# Patient Record
Sex: Male | Born: 1950 | Race: Black or African American | Hispanic: No | Marital: Single | Smoking: Former smoker
Health system: Southern US, Community
[De-identification: ages and names within clinical notes are randomized; demographics above are authoritative.]

## PROBLEM LIST (undated history)

## (undated) DIAGNOSIS — I519 Heart disease, unspecified: Secondary | ICD-10-CM

## (undated) DIAGNOSIS — F0390 Unspecified dementia without behavioral disturbance: Secondary | ICD-10-CM

## (undated) DIAGNOSIS — E7800 Pure hypercholesterolemia, unspecified: Secondary | ICD-10-CM

## (undated) DIAGNOSIS — I10 Essential (primary) hypertension: Secondary | ICD-10-CM

## (undated) HISTORY — PX: OTHER SURGICAL HISTORY: SHX169

## (undated) HISTORY — DX: Pure hypercholesterolemia, unspecified: E78.00

## (undated) HISTORY — DX: Essential (primary) hypertension: I10

## (undated) HISTORY — DX: Unspecified dementia, unspecified severity, without behavioral disturbance, psychotic disturbance, mood disturbance, and anxiety: F03.90

## (undated) HISTORY — DX: Heart disease, unspecified: I51.9

---

## 2019-10-13 ENCOUNTER — Ambulatory Visit: Payer: Medicare HMO | Admitting: Neurology

## 2019-10-20 ENCOUNTER — Encounter: Payer: Self-pay | Admitting: Neurology

## 2019-10-20 ENCOUNTER — Other Ambulatory Visit: Payer: Self-pay

## 2019-10-20 ENCOUNTER — Telehealth: Payer: Self-pay | Admitting: *Deleted

## 2019-10-20 ENCOUNTER — Ambulatory Visit (INDEPENDENT_AMBULATORY_CARE_PROVIDER_SITE_OTHER): Payer: Medicare HMO | Admitting: Neurology

## 2019-10-20 VITALS — BP 136/69 | HR 44 | Temp 97.5°F | Ht 67.5 in | Wt 233.0 lb

## 2019-10-20 DIAGNOSIS — F0391 Unspecified dementia with behavioral disturbance: Secondary | ICD-10-CM

## 2019-10-20 DIAGNOSIS — G309 Alzheimer's disease, unspecified: Secondary | ICD-10-CM | POA: Diagnosis not present

## 2019-10-20 DIAGNOSIS — R4189 Other symptoms and signs involving cognitive functions and awareness: Secondary | ICD-10-CM | POA: Diagnosis not present

## 2019-10-20 DIAGNOSIS — R4689 Other symptoms and signs involving appearance and behavior: Secondary | ICD-10-CM

## 2019-10-20 DIAGNOSIS — F03918 Unspecified dementia, unspecified severity, with other behavioral disturbance: Secondary | ICD-10-CM | POA: Insufficient documentation

## 2019-10-20 DIAGNOSIS — F688 Other specified disorders of adult personality and behavior: Secondary | ICD-10-CM | POA: Diagnosis not present

## 2019-10-20 NOTE — Telephone Encounter (Signed)
R/c medical records from [facility] [phone] fax phone # [phone]. Notes on [place] desk

## 2019-10-20 NOTE — Patient Instructions (Addendum)
Blood work today MRI of the brain - to look at the structure of the brain (for example, look for strokes or other causes of dementia) - will call to schedule FDG PET SCan - For dementia - see information given - will call to schedule Need records from the [place] Follow up when all is completed above 3 or 4 months Continue Namenda   Dementia Dementia is a condition that affects the way the brain functions. It often affects memory and thinking. Usually, dementia gets worse with time and cannot be reversed (progressive dementia). There are many types of dementia, including:  Alzheimer's disease. This type is the most common.  Vascular dementia. This type may happen as the result of a stroke.  Lewy body dementia. This type may happen to people who have Parkinson's disease.  Frontotemporal dementia. This type is caused by damage to nerve cells (neurons) in certain parts of the brain. Some people may be affected by more than one type of dementia. This is called mixed dementia. What are the causes? Dementia is caused by damage to cells in the brain. The area of the brain and the types of cells damaged determine the type of dementia. Usually, this damage is irreversible or cannot be undone. Some examples of irreversible causes include:  Conditions that affect the blood vessels of the brain, such as diabetes, heart disease, or blood vessel disease.  Genetic mutations. In some cases, changes in the brain may be caused by another condition and can be reversed or slowed. Some examples of reversible causes include:  Injury to the brain.  Certain medicines.  Infection, such as meningitis.  Metabolic problems, such as vitamin B12 deficiency or thyroid disease.  Pressure on the brain, such as from a tumor or blood clot. What are the signs or symptoms? Symptoms of dementia depend on the type of dementia. Common signs of dementia include problems with remembering, thinking, problem solving, decision  making, and communicating. These signs develop slowly or get worse with time. This may include:  Problems remembering things.  Having trouble taking a bath or putting clothes on.  Forgetting appointments.  Forgetting to pay bills.  Difficulty planning and preparing meals.  Having trouble speaking.  Getting lost easily. How is this diagnosed? This condition is diagnosed by a specialist (neurologist). It is diagnosed based on the history of your symptoms, your medical history, a physical exam, and tests. Tests may include:  Tests to evaluate brain function, such as memory tests, cognitive tests, and other tests.  Lab tests, such as blood or urine tests.  Imaging tests, such as a CT scan, a PET scan, or an MRI.  Genetic testing. This may be done if other family members have a diagnosis of certain types of dementia. Your health care provider will talk with you and your family, friends, or caregivers about your history and symptoms. How is this treated?  Treatment for this condition depends on the cause of the dementia. Progressive dementias, such as Alzheimer's disease, cannot be cured, but there may be treatments that help to manage symptoms. Treatment might involve taking medicines that may help to:  Control the dementia.  Slow down the progression of the dementia.  Manage symptoms. In some cases, treating the cause of your dementia can improve symptoms, reverse symptoms, or slow down how quickly your dementia becomes worse. Your health care provider can direct you to support groups, organizations, and other health care providers who can help with decisions about your care. Follow these  instructions at home: Medicines  Take over-the-counter and prescription medicines only as told by your health care provider.  Use a pill organizer or pill reminder to help you manage your medicines.  Avoid taking medicines that can affect thinking, such as pain medicines or sleeping  medicines. Lifestyle  Make healthy lifestyle choices. ? Be physically active as told by your health care provider. ? Do not use any products that contain nicotine or tobacco, such as cigarettes, e-cigarettes, and chewing tobacco. If you need help quitting, ask your health care provider. ? Do not drink alcohol. ? Practice stress-management techniques when you get stressed. ? Spend time with other people.  Make sure to get quality sleep. These tips can help you get a good night's rest: ? Avoid napping during the day. ? Keep your sleeping area dark and cool. ? Avoid exercising during the few hours before you go to bed. ? Avoid caffeine products in the evening. Eating and drinking  Drink enough fluid to keep your urine pale yellow.  Eat a healthy diet. General instructions   Work with your health care provider to determine what you need help with and what your safety needs are.  Talk with your health care provider about whether it is safe for you to drive.  If you were given a bracelet that identifies you as a person with memory loss or tracks your location, make sure to wear it at all times.  Work with your family to make important decisions, such as advance directives, medical power of attorney, or a living will.  Keep all follow-up visits as told by your health care provider. This is important. Where to find more information  Alzheimer's Association: CapitalMile.co.nz  National Institute on Aging: DVDEnthusiasts.nl  World Health Organization: RoleLink.com.br Contact a health care provider if:  You have any new or worsening symptoms.  You have problems with choking or swallowing. Get help right away if:  You feel depressed or sad, or feel that you want to harm yourself.  Your family members become concerned for your safety. If you ever feel like you may hurt yourself or others, or have thoughts about taking your own life, get help right away. You can go to your nearest  emergency department or call:  Your local emergency services (911 in the [place]  A suicide crisis helpline, such as the [place] at (660)131-5096. This is open 24 hours a day. Summary  Dementia is a condition that affects the way the brain functions. Dementia often affects memory and thinking.  Usually, dementia gets worse with time and cannot be reversed (progressive dementia).  Treatment for this condition depends on the cause of the dementia.  Work with your health care provider to determine what you need help with and what your safety needs are.  Your health care provider can direct you to support groups, organizations, and other health care providers who can help with decisions about your care. This information is not intended to replace advice given to you by your health care provider. Make sure you discuss any questions you have with your health care provider. Document Revised: 08/27/2018 Document Reviewed: 08/27/2018 Elsevier Patient Education  [place].

## 2019-10-20 NOTE — Progress Notes (Signed)
[name]    Provider:  Dr Lucia Gaskins Requesting Provider: Chari Manning, DO Primary Care Provider:  Chari Manning, DO  CC:  Memory loss  HPI:  Evan Tran is a 69 y.o. male here as requested by Bebe Liter, Virgilus U, DO for early onset Alzheimer's dementia without behavioral disturbance.  Past medical history hypertension, hypercholesterolemia, heart disease, dementia.  Patient was recently diagnosed with early onset dementia at the [facility] facility per Dr. Trey Sailors notes which I reviewed: Patient recently had blood work done at the [facility] in October 2020 which was negative for medical causes of dementia, he was prescribed Namenda which at the time he had not started, he was also requesting another referral to another neurologist outside of the [place].  Unfortunately I do not have the [place] notes, I can try to request them which is oftentimes difficult but we will try.  Right I reviewed our epic EMR as well as care everywhere and unfortunately I have no other information on this patient. He is here with his sister who provides most information. He was going to the [facility] Per family no imaging or blood work was completed.  Family noticed 6 months ago, worsening, unclear how long really, started forgetting things more short-term memory loss, socially withdrawn, he lives in [place], he lives with a roommate, he is still driving, he has not had accidents in the car, he doesn't know if he has ever gotten lost, sister's help with finances he can;t manage finances anymore they started helping in December, he was missing bills, sisters live near him are more involved, sister has to help him with appointments, he doesn't know what day it is or month, he denies feeling sad, apathetic, he gets angry and frustrated and he is starting to yell and do things he would not normally do in the past, he is cursing and it is unusual never did it in the past, no hallucinations, no  delusions, he has a very rigid schedule almost obsessive with his schedule, he goes to his sister's house every morning for breakfast.Half brother with alzheimers. He has a history of alcohol use unknown amount, no drugs, used to smoke. 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 Hygiene is fine, eats himself but doesn't cook anymore. He denies snoring or significant daytime drowsiness.   Reviewed notes, labs and imaging from outside physicians, which showed:  I have no other records, I will request notes from the [place].  Review of Systems: Patient complains of symptoms per HPI as well as the following symptoms: memory loss, obesity. Pertinent negatives and positives per HPI. All others negative.   Social History   Socioeconomic History  . Marital status: Single    Spouse name: Not on file  . Number of children: Not on file  . Years of education: Not on file  . Highest education level: Not on file  Occupational History  . Not on file  Tobacco Use  . Smoking status: Former Games developer  . Smokeless tobacco: Never Used  Substance and Sexual Activity  . Alcohol use: Not Currently    Comment: in the past   . Drug use: Never  . Sexual activity: Not on file  Other Topics Concern  . Not on file  Social History Narrative   Lives with a friend    Social Determinants of Health   Financial Resource Strain:   . Difficulty of Paying Living Expenses:   Food Insecurity:   . Worried  About Running Out of Food in the Last Year:   . [place] in the Last Year:   Transportation Needs:   . Lack of Transportation (Medical):   Marland Kitchen Lack of Transportation (Non-Medical):   Physical Activity:   . Days of Exercise per Week:   . Minutes of Exercise per Session:   Stress:   . Feeling of Stress :   Social Connections:   . Frequency of Communication with Friends and Family:   . Frequency of Social Gatherings with Friends and Family:   . Attends Religious Services:   . Active Member of Clubs or  Organizations:   . Attends Archivist Meetings:   Marland Kitchen Marital Status:   Intimate Partner Violence:   . Fear of Current or Ex-Partner:   . Emotionally Abused:   Marland Kitchen Physically Abused:   . Sexually Abused:     Family History  Problem Relation Age of Onset  . Heart attack Mother   . Lung cancer Father   . Alzheimer's disease Brother        half brother     Past Medical History:  Diagnosis Date  . Dementia ([place])   . Heart disease    stents   . Hypercholesteremia   . Hypertension     Patient Active Problem List   Diagnosis Date Noted  . Dementia with behavioral disturbance ([place]) 10/20/2019    Past Surgical History:  Procedure Laterality Date  . heart stents placed      Current Outpatient Medications  Medication Sig Dispense Refill  . amLODipine (NORVASC) 10 MG tablet Take 10 mg by mouth daily.    Marland Kitchen atorvastatin (LIPITOR) 80 MG tablet Take 80 mg by mouth daily.    . carvedilol (COREG) 12.5 MG tablet Take 2 tablets by mouth in the morning and at bedtime.    . hydrochlorothiazide (HYDRODIURIL) 25 MG tablet Take 25 mg by mouth daily.    Marland Kitchen losartan (COZAAR) 100 MG tablet Take 100 mg by mouth daily.    . memantine (NAMENDA) 5 MG tablet Take 5 mg by mouth in the morning and at bedtime.    . potassium chloride SA (KLOR-CON) 20 MEQ tablet Take 1 tablet by mouth in the morning and at bedtime.     No current facility-administered medications for this visit.    Allergies as of 10/20/2019 - Review Complete 10/20/2019  Allergen Reaction Noted  . Lisinopril Cough 04/20/2008    Vitals: BP 136/69 (BP Location: Right Arm, Patient Position: Sitting)   Pulse (!) 44   Temp (!) 97.5 F (36.4 C) Comment: sister 97.8 both taken at front  Ht 5' 7.5" (1.715 m)   Wt 233 lb (105.7 kg)   BMI 35.95 kg/m  Last Weight:  Wt Readings from Last 1 Encounters:  10/20/19 233 lb (105.7 kg)   Last Height:   Ht Readings from Last 1 Encounters:  10/20/19 5' 7.5" (1.715 m)      Physical exam: Exam: Gen: flat affect, irritable, not conversant                CV: RRR, no MRG. No Carotid Bruits. No peripheral edema, warm, nontender Eyes: Conjunctivae clear without exudates or hemorrhage  Neuro: Detailed Neurologic Exam  Speech:    Speech is  fluent with impaired comprehension.  Cognition:  MMSE - Mini Mental State Exam 10/20/2019  Orientation to time 0  Orientation to time comments pt perseverated, kept giving his birthdate as the answer  Orientation  to Place 0  Orientation to Place-comments pt perseverated, kept giving his birthdate as the answer  Registration 3  Attention/ Calculation 0  Attention/Calculation-comments pt refused to attempt both calculation & spelling WORLD  Recall 0  Language- name 2 objects 2  Language- repeat 1  Language- follow 3 step command 3  Language- read & follow direction 0  Language-read & follow direction-comments pt refused to participate when command asked  Write a sentence 0  Write a sentence-comments pt refused to participate when command asked  Copy design 0  Copy design-comments pt refused to participate when command asked  Total score 9    Cranial Nerves:    The pupils are equal, round, and reactive to light. attempted fundoscopy could not visualize.  Visual fields are full to finger confrontation. Extraocular movements are intact. Trigeminal sensation is intact and the muscles of mastication are normal. The face is symmetric. The palate elevates in the midline. Hearing intact. Voice is normal. Shoulder shrug is normal. The tongue has normal motion without fasciculations.   Coordination:    Normal finger to nose   Gait:    Slightly wide based and antalgic, flexion at the waist (chronic LBP). But can walk on heels,toes and tandem.  Motor Observation:    No asymmetry, no atrophy, and no involuntary movements noted. Tone:    Normal muscle tone.       Strength:    Strength is V/V in the upper and lower  limbs.      Sensation: intact to LT     Reflex Exam:  DTR's:    Deep tendon reflexes in the upper and lower extremities are normal bilaterally.   Toes:    Left equiv right down  Clonus:    Clonus is absent.    Assessment/Plan:  69 year old that appears to be significantly cognitively impaired however I really don;t have any previous records, will request from the [place]. Alzheimer's vs Frontotemporal or vascular dementia or possibly overlapping types.  - MRI of the brain to eval for causes of dementia, vascular/strokes or other - NEED FDG PET Scan, patient with symptoms of both ALZ but also FTD need to differentiate between the two.  He refused to answer many of the questions on the Mini-Mental status exam today, I am doubtful we could complete Montreal cognitive assessment test or formal memory testing MMSE 9/30  HR is 44, aricept contraindicated. Continue namenda.   Orders Placed This Encounter  Procedures  . MR BRAIN W WO CONTRAST  . NM PET Metabolic Brain  . Comprehensive metabolic panel  . CBC  . B12 and Folate Panel  . Methylmalonic acid, serum  . Vitamin B1  . RPR  . Homocysteine  . HIV antibody (with reflex)     Cc: Ozoude, Virgilus U, DO,    Naomie Dean, MD  I spent 65 minutes of face-to-face and non-face-to-face time with patient on the  1. Dementia with behavioral disturbance, unspecified dementia type (HCC)   2. Cognitive and behavioral changes   3. Personality change   4. Cognitive decline   5. Alzheimer's disease, unspecified (CODE) (HCC)    diagnosis.  This included previsit chart review, lab review, study review, order entry, electronic health record documentation, patient education on the different diagnostic and therapeutic options, counseling and coordination of care, risks and benefits of management, compliance, or risk factor reduction   [facility] [address] [place], [place] [address]  Phone [phone]  Fax [phone]

## 2019-10-22 ENCOUNTER — Telehealth: Payer: Self-pay | Admitting: Neurology

## 2019-10-22 NOTE — Telephone Encounter (Signed)
Ethlyn Gallery: [id number] (exp. 10/22/19 to 11/21/19) order sent to GI. They will reach out to the patient to schedule.

## 2019-10-23 ENCOUNTER — Telehealth: Payer: Self-pay | Admitting: *Deleted

## 2019-10-23 NOTE — Telephone Encounter (Signed)
Called patient's home, spoke with sister and informed her his labs are okay. She verbalized understanding, appreciation.

## 2019-10-25 LAB — COMPREHENSIVE METABOLIC PANEL
ALT: 24 IU/L (ref 0–44)
AST: 21 IU/L (ref 0–40)
Albumin/Globulin Ratio: 1.9 (ref 1.2–2.2)
Albumin: 4.3 g/dL (ref 3.8–4.8)
Alkaline Phosphatase: 108 IU/L (ref 39–117)
BUN/Creatinine Ratio: 9 — ABNORMAL LOW (ref 10–24)
BUN: 11 mg/dL (ref 8–27)
Bilirubin Total: 0.4 mg/dL (ref 0.0–1.2)
CO2: 28 mmol/L (ref 20–29)
Calcium: 10 mg/dL (ref 8.6–10.2)
Chloride: 101 mmol/L (ref 96–106)
Creatinine, Ser: 1.18 mg/dL (ref 0.76–1.27)
GFR calc Af Amer: 73 mL/min/{1.73_m2} (ref >59)
GFR calc non Af Amer: 63 mL/min/{1.73_m2} (ref >59)
Globulin, Total: 2.3 g/dL (ref 1.5–4.5)
Glucose: 102 mg/dL — ABNORMAL HIGH (ref 65–99)
Potassium: 3.8 mmol/L (ref 3.5–5.2)
Sodium: 142 mmol/L (ref 134–144)
Total Protein: 6.6 g/dL (ref 6.0–8.5)

## 2019-10-25 LAB — B12 AND FOLATE PANEL
Folate: 9.7 ng/mL (ref >3.0)
Vitamin B-12: 416 pg/mL (ref 232–1245)

## 2019-10-25 LAB — RPR: RPR Ser Ql: NONREACTIVE

## 2019-10-25 LAB — METHYLMALONIC ACID, SERUM: Methylmalonic Acid: 217 nmol/L (ref 0–378)

## 2019-10-25 LAB — CBC
Hematocrit: 41.8 % (ref 37.5–51.0)
Hemoglobin: 13.9 g/dL (ref 13.0–17.7)
MCH: 29.4 pg (ref 26.6–33.0)
MCHC: 33.3 g/dL (ref 31.5–35.7)
MCV: 88 fL (ref 79–97)
Platelets: 305 10*3/uL (ref 150–450)
RBC: 4.73 x10E6/uL (ref 4.14–5.80)
RDW: 12.5 % (ref 11.6–15.4)
WBC: 6 10*3/uL (ref 3.4–10.8)

## 2019-10-25 LAB — HIV ANTIBODY (ROUTINE TESTING W REFLEX): HIV Screen 4th Generation wRfx: NONREACTIVE

## 2019-10-25 LAB — VITAMIN B1: Thiamine: 136.5 nmol/L (ref 66.5–200.0)

## 2019-10-25 LAB — HOMOCYSTEINE: Homocysteine: 14 umol/L (ref 0.0–17.2)

## 2019-11-05 ENCOUNTER — Ambulatory Visit (HOSPITAL_COMMUNITY): Payer: Medicare HMO

## 2019-11-07 ENCOUNTER — Other Ambulatory Visit: Payer: Self-pay

## 2019-11-07 ENCOUNTER — Ambulatory Visit (HOSPITAL_COMMUNITY)
Admission: RE | Admit: 2019-11-07 | Discharge: 2019-11-07 | Disposition: A | Discharge status: Home / Self Care | Payer: Medicare HMO | Source: Ambulatory Visit | Attending: Neurology | Admitting: Neurology

## 2019-11-07 DIAGNOSIS — F688 Other specified disorders of adult personality and behavior: Secondary | ICD-10-CM | POA: Diagnosis present

## 2019-11-07 DIAGNOSIS — R4689 Other symptoms and signs involving appearance and behavior: Secondary | ICD-10-CM | POA: Diagnosis present

## 2019-11-07 DIAGNOSIS — R4189 Other symptoms and signs involving cognitive functions and awareness: Secondary | ICD-10-CM

## 2019-11-07 DIAGNOSIS — F0391 Unspecified dementia with behavioral disturbance: Secondary | ICD-10-CM | POA: Diagnosis present

## 2019-11-07 LAB — GLUCOSE, CAPILLARY: Glucose-Capillary: 96 mg/dL (ref 70–99)

## 2019-11-07 MED ORDER — FLUDEOXYGLUCOSE F - 18 (FDG) INJECTION
9.9600 | Freq: Once | INTRAVENOUS | Status: AC
  Administered 2019-11-07: 9.96 via INTRAVENOUS

## 2019-11-11 ENCOUNTER — Telehealth: Payer: Self-pay | Admitting: *Deleted

## 2019-11-11 NOTE — Telephone Encounter (Signed)
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 This is the second most common dementia next to Alzheimer's disease. Please talk to wife and we can discuss further at appointment. She can find information about this dementia on WesternTunes.[email or web address].  Frontotemporal dementia (FTD), a common cause of dementia, is a group of disorders that occur when nerve cells in the frontal and temporal lobes of the brain are lost. This causes the lobes to shrink. FTD can affect behavior, personality, language, and movement.

## 2019-11-11 NOTE — Telephone Encounter (Signed)
Spoke with pt's sister Lottie (on [place]) and discussed the pet metabolic brain scan results as noted below consistent with FTD. Her questions were answered. She asked if MRI brain still needed and I advised yes per Dr. Lucia Gaskins she would like to still have this done. The pet metabolic results will be discussed in more detail at the follow-up. She verbalized appreciation for the call.

## 2019-11-20 ENCOUNTER — Other Ambulatory Visit: Payer: Self-pay

## 2019-11-20 ENCOUNTER — Ambulatory Visit
Admission: RE | Admit: 2019-11-20 | Discharge: 2019-11-20 | Disposition: A | Discharge status: Home / Self Care | Payer: Medicare HMO | Source: Ambulatory Visit | Attending: Neurology | Admitting: Neurology

## 2019-11-20 DIAGNOSIS — F0391 Unspecified dementia with behavioral disturbance: Secondary | ICD-10-CM | POA: Diagnosis not present

## 2019-11-20 MED ORDER — GADOBENATE DIMEGLUMINE 529 MG/ML IV SOLN
15.0000 mL | Freq: Once | INTRAVENOUS | Status: AC | PRN
  Administered 2019-11-20: 15 mL via INTRAVENOUS

## 2019-11-27 ENCOUNTER — Telehealth: Payer: Self-pay | Admitting: *Deleted

## 2019-11-27 NOTE — Telephone Encounter (Signed)
-----   Message from Anson Fret, MD sent at 11/22/2019  8:45 AM EDT ----- MRI of the brain is also consistent with Frontotemporal dementia, supporting the findings of the PET scan. thanks

## 2019-11-27 NOTE — Telephone Encounter (Signed)
Spoke with pt's sister Lottie (on [place]) and discussed MRI brain results are also consistent with frontotemporal dementia which supports the findings on the FDG pet scan that the patient had. Her questions were answered. Pt's next office visit is on 02/18/20 @ 8:30 AM. She verbalized appreciation for the call.

## 2020-02-17 ENCOUNTER — Telehealth: Payer: Self-pay | Admitting: Neurology
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
## 2020-02-17 NOTE — Telephone Encounter (Signed)
Would you please call patient and see if they would like to schedule tomorrow as video visit due to the delta variant increase? We are seeing even vaccinated people infected as they know.  Or they can  Reschedule for office visit for a few months in the future with Amy or Megan to give everyone a chance to get the booster shot and for the delta variant to hopefully decrease. thanks

## 2020-02-18 ENCOUNTER — Ambulatory Visit (INDEPENDENT_AMBULATORY_CARE_PROVIDER_SITE_OTHER): Payer: Medicare HMO | Admitting: Neurology

## 2020-02-18 ENCOUNTER — Encounter: Payer: Self-pay | Admitting: Neurology

## 2020-02-18 VITALS — BP 121/70 | HR 43 | Ht 66.0 in | Wt 231.0 lb

## 2020-02-18 DIAGNOSIS — G3109 Other frontotemporal dementia: Secondary | ICD-10-CM | POA: Insufficient documentation

## 2020-02-18 DIAGNOSIS — F0281 Dementia in other diseases classified elsewhere with behavioral disturbance: Secondary | ICD-10-CM

## 2020-02-18 NOTE — Patient Instructions (Signed)
Recommend Neurology at the [facility] and psychiatry Stop Memantine   Frontotemporal Dementia  Frontotemporal dementia (FTD) is a rare, progressive brain disorder that causes memory loss. FTD describes a range of diseases that often start with changes in behavior, speech, and thinking. As FTD progresses, it affects short-term memory. Over time, FTD causes the frontal and temporal anterior lobes of the brain to shrink. These are the parts of the brain that control behavior and speech. There are three main types of FTD:  Behavioral variant FTD. This is the most common type and was previously called Pick disease.  Progressive agrammatic aphasia.  Semantic variant primary progressive aphasia. FTD is one of the most common causes of progressive memory loss in people younger than 60 years. The disease progresses faster in some people than in others. In some families, FTD can be associated with amyotrophic lateral sclerosis (ALS). There is no cure for FTD, but treatment and supportive care can improve a person's quality of life. What are the causes? The exact cause of this condition is not known, although many genetic mutations are known to cause the disease. What increases the risk? This condition is more likely to occur in people who have a family history of FTD. Family members of people with FTD should think about genetic counseling. What are the signs or symptoms? Signs and symptoms of FTD usually start between the ages of 55-65 years. Symptoms may include:  Impulsive, poorly controlled, inappropriate, or embarrassing behavior.  Lack of motivation and interest (apathy).  Irritability and agitation.  Neglect of personal hygiene.  Withdrawal.  Inappropriate crying or laughing (pseudobulbar affect).  Repetitive behaviors.  Impulsive eating  Lack of concern for others.  Failure to recognize behavioral changes.  Loss of the ability to speak fluently.  Inability to write or  read.  Slurred speech.  Loss of vocabulary.  New drug or alcohol abuse. Short-term memory loss is also a symptom later in the disease. How is this diagnosed? Your health care provider may suspect FTD if you have worsening behavior or speech difficulties. You may need to see specialists in brain and behavioral health who will collect your medical history and do a neurological examination. The following tests may be done:  Blood tests to rule out other causes, like vitamin deficiency, harmful effects of substances (toxicities), and infections.  Spinal tap (lumbar puncture) to check spinal fluid samples for abnormal proteins.  Imaging tests, such as a CT scan, PET scan, or MRI. These can show brain changes that suggest FTD or another brain disorder.  Memory testing (neuropsychological testing), which involves several hours of standardized tests to check the many functions of the brain. How is this treated? There is no cure for this condition and the progression of FTD cannot be stopped. Support at home is the most important aspect of managing FTD. Ask about caregiving resources in your community. Management of this condition may include:  Antidepressants to help with apathy.  Medicines to treat pseudobulbar affect.  Sedative medicines to control aggressive or dangerous behavior.  Speech and language therapy.  Behavioral therapy.  Occupational therapy to help with home safety and activity. Institutional or supportive care may eventually be needed. Follow these instructions at home: Eating and drinking  Follow a healthy diet. Eat foods that are high in fiber, such as fresh fruits and vegetables, whole grains, and beans.  Avoid having too much sugar and caffeine in your diet.  Watch for signs of compulsive eating, which can lead to other health  problems.  Do not drink alcohol.  Drink enough fluid to keep your urine pale yellow. Lifestyle  Keep a regular routine.  Avoid  stress and new situations.  Avoid any activities that may trigger aggressive behavior.  Schedule regular, enjoyable, and supervised physical activity. General instructions  Take over-the-counter and prescription medicines only as told by your health care provider.  If you were given a bracelet that tracks your location, make sure you wear it.  Work with your health care provider to determine what you need help with and what your safety needs are.  Keep all follow-up visits, including any therapy visits, as told by your health care provider. This is important. Contact a health care provider if:  Your symptoms change or get worse.  It becomes more difficult or stressful to be cared for at home. Get help right away if:  It is no longer possible to be cared for at home.  You or your family members become concerned for your safety. Summary  Frontotemporal dementia (FTD) is a rare, progressive brain disorder that causes memory loss.  There is no cure for this condition and the progression of FTD cannot be stopped.  Support at home is the most important aspect of managing FTD. Ask about caregiving resources in your community.  Work with your health care provider to determine what you need help with and what your safety needs are. This information is not intended to replace advice given to you by your health care provider. Make sure you discuss any questions you have with your health care provider. Document Revised: 10/03/2018 Document Reviewed: 09/05/2016 Elsevier Patient Education  2020 [employer].

## 2020-02-23 ENCOUNTER — Telehealth: Payer: Self-pay | Admitting: *Deleted

## 2020-02-23 NOTE — Telephone Encounter (Signed)
DMV form completed, signed, and sent to medical records for processing. MD advises no driving.

## 2020-02-25 ENCOUNTER — Telehealth: Payer: Self-pay | Admitting: *Deleted

## 2020-02-25 NOTE — Telephone Encounter (Signed)
Called LVM unable to reach pt. Pt need to fill out his part of the form. Please ask for [facility]

## 2020-02-26 ENCOUNTER — Telehealth: Payer: Self-pay | Admitting: *Deleted

## 2020-02-26 NOTE — Telephone Encounter (Signed)
Mailbox is full.

## 2020-11-15 IMAGING — PT NM PET TUM IMG INITIAL (PI) WHOLE BODY
1 series · 19 of 25 positions shown · non-contrast
Comparison: Brain MRI 10/20/2019 (unavailable)

CLINICAL DATA: Dementia. Frontotemporal dementia versus Alzheimer's
type dementia

EXAM:
NM PET METABOLIC BRAIN
TECHNIQUE: 10.0 mCi F-18 FDG was injected intravenously. Full-ring PET imaging
was performed from the vertex to skull base. CT data was obtained
and used for attenuation correction and anatomic localization.
FASTING BLOOD GLUCOSE:  Value: 96 mg/dl

[Series 1045: mpr 3mm range · 0.83mm/px · 19 of 41 slices shown]
[im 1/41]
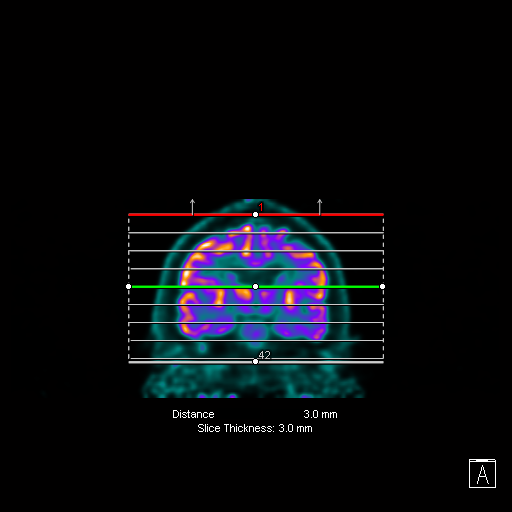
[im 2/41]
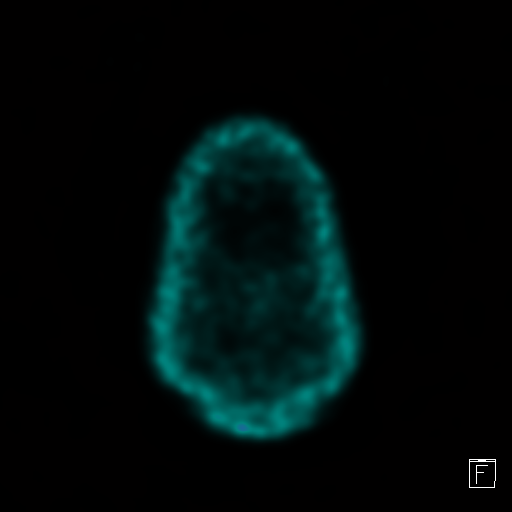
[im 6/41]
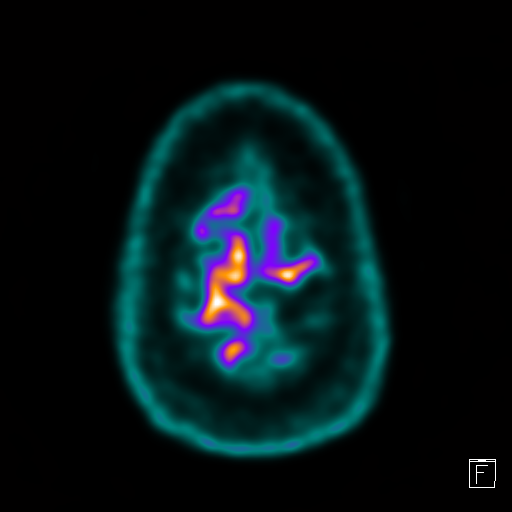
[im 7/41]
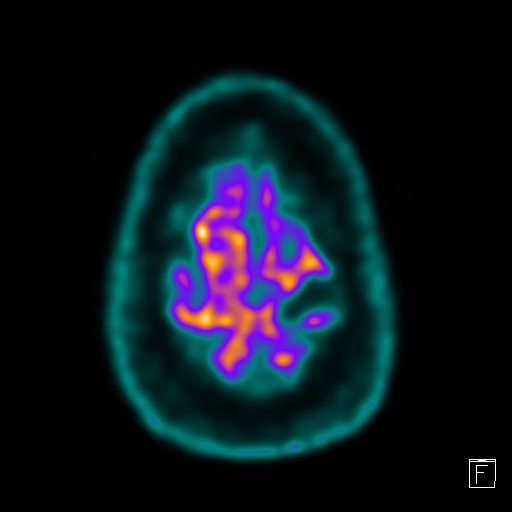
[im 9/41]
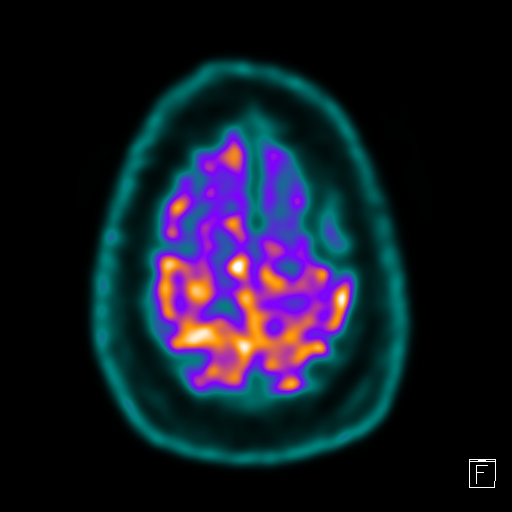
[im 12/41]
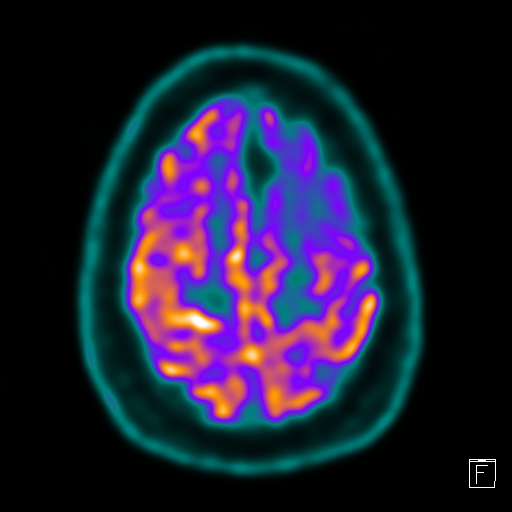
[im 14/41]
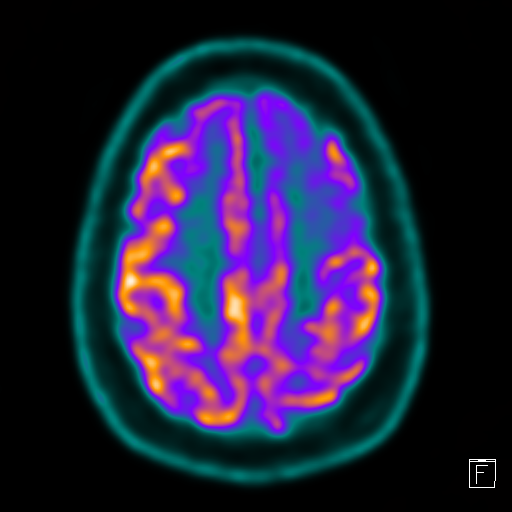
[im 16/41]
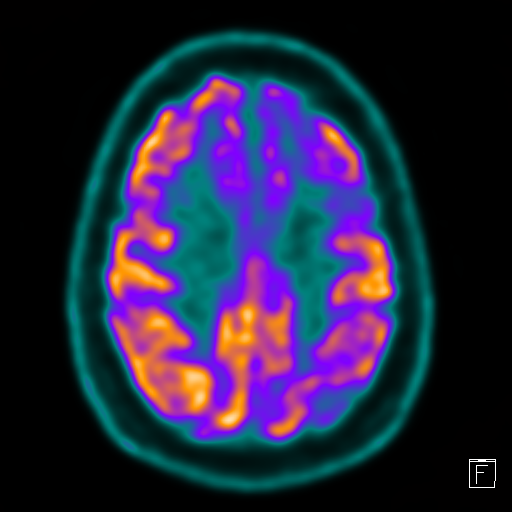
[im 19/41]
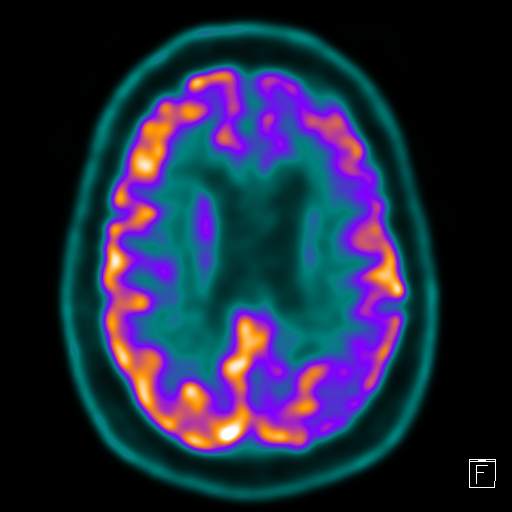
[im 21/41]
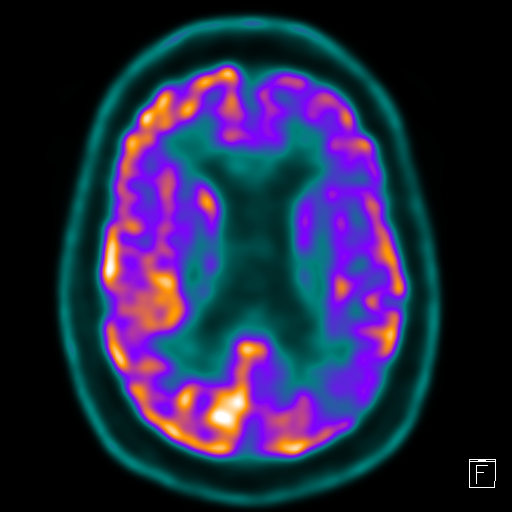
[im 22/41]
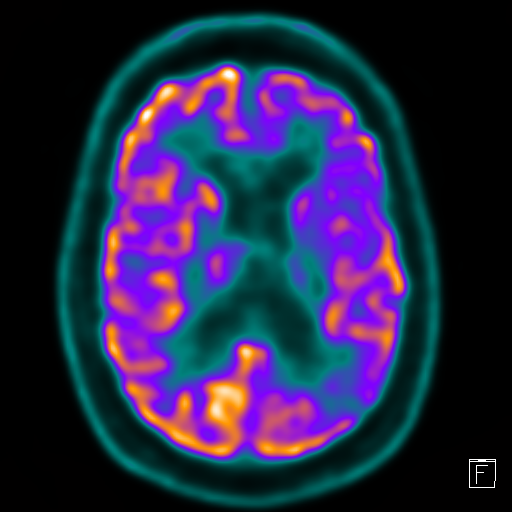
[im 26/41]
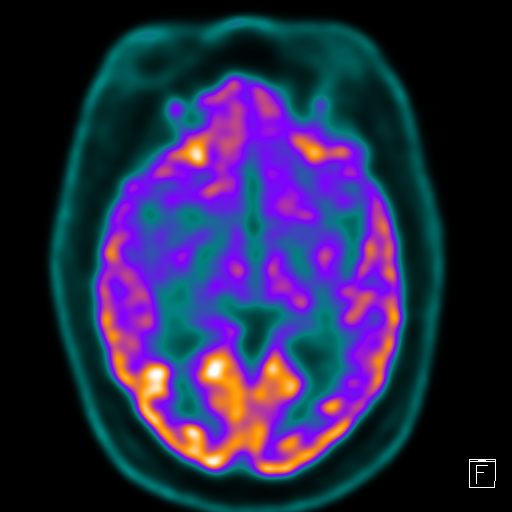
[im 27/41]
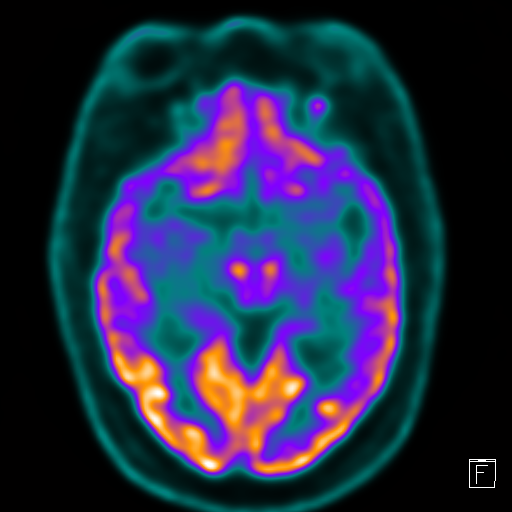
[im 29/41]
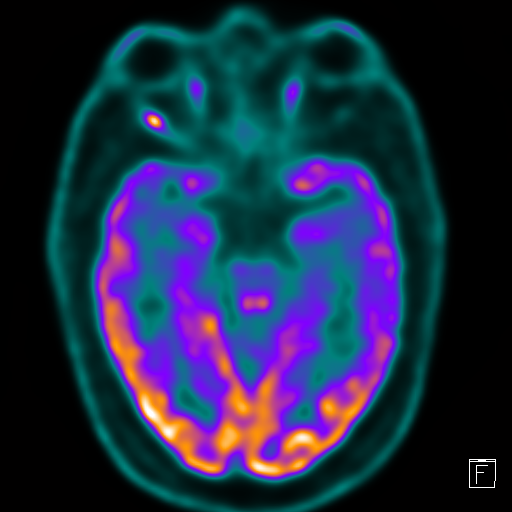
[im 32/41]
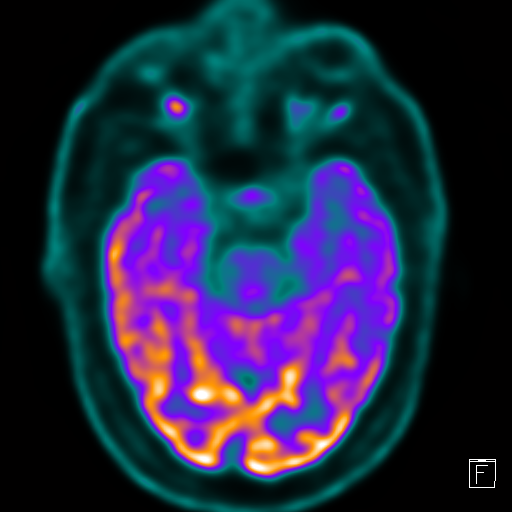
[im 34/41]
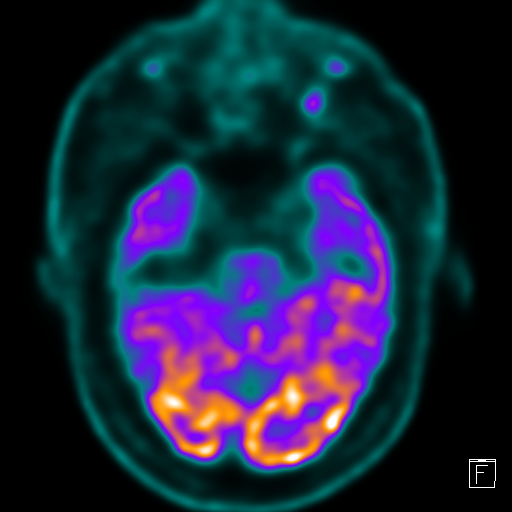
[im 36/41]
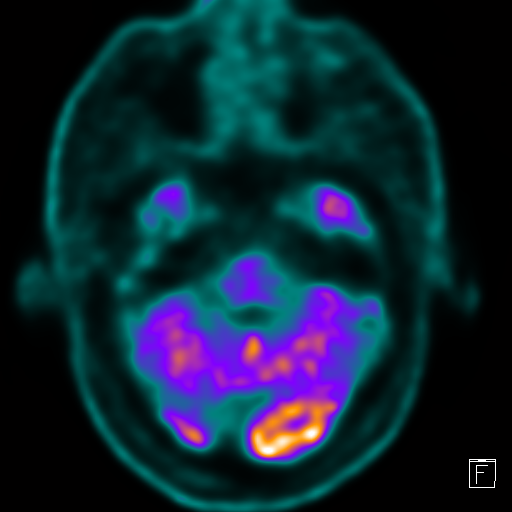
[im 39/41]
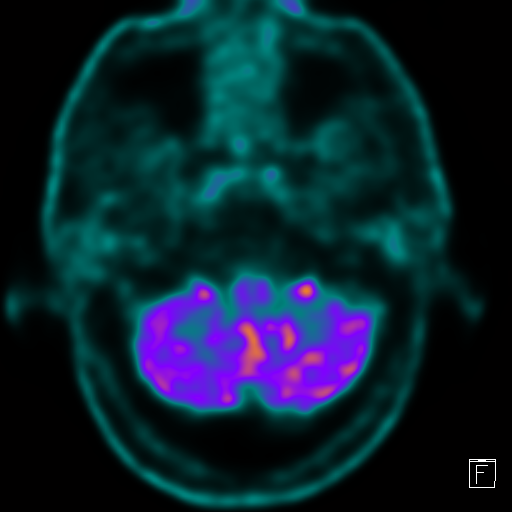
[im 41/41]
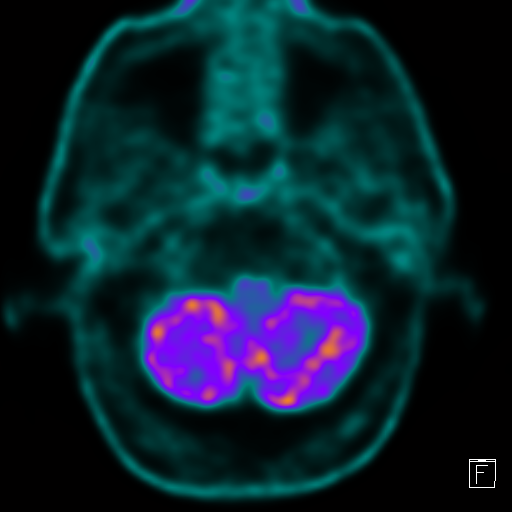

[19 of 25 positions shown; findings below may reference images not displayed]

FINDINGS: The dominant finding is decreased relative cortical metabolism
within the LEFT frontal lobe compared to the parietal lobes and
contralateral RIGHT frontal lobe. This is a broad region of
decreased cortical activity involving the anterior and superior LEFT
frontal lobe. (Images 10 through 15 series 43 post processed data
set

There is a additional region of decreased cortical activity within
the inferior LEFT parietal lobe (image 23 of the post processed data
set). This however is through a region prominent deep cortical
sulci.

The activity in the higher parietal lobes, which is the region
typically affected by Alzheimer's pathology, has normal cortical
activity.

The occipital lobes have normal cortical activity.
IMPRESSION: 1. Dominant finding is large region decreased relative cortical
metabolism in the LEFT frontal lobe which is suggestive or
frontotemporal dementia pathology.
2. Second region of decreased cortical activity in the inferior LEFT
parietal lobe is indeterminate. Decreased activity could in part be
explained by prominent cortical sulcation through this region.
Typically Alzheimer's pathology involves the parietal lobes more
superiorly.
